# Patient Record
Sex: Female | Born: 1965 | Race: White | Hispanic: No | Marital: Married | Smoking: Former smoker
Health system: Southern US, Community
[De-identification: ages and names within clinical notes are randomized; demographics above are authoritative.]

## PROBLEM LIST (undated history)

## (undated) DIAGNOSIS — M7670 Peroneal tendinitis, unspecified leg: Secondary | ICD-10-CM

## (undated) DIAGNOSIS — R091 Pleurisy: Secondary | ICD-10-CM

## (undated) DIAGNOSIS — K589 Irritable bowel syndrome without diarrhea: Secondary | ICD-10-CM

## (undated) DIAGNOSIS — T4145XA Adverse effect of unspecified anesthetic, initial encounter: Secondary | ICD-10-CM

## (undated) DIAGNOSIS — T8859XA Other complications of anesthesia, initial encounter: Secondary | ICD-10-CM

## (undated) HISTORY — PX: COLONOSCOPY: SHX174

## (undated) HISTORY — PX: ABDOMINAL HYSTERECTOMY: SHX81

## (undated) HISTORY — PX: ELBOW SURGERY: SHX618

---

## 2004-12-26 ENCOUNTER — Emergency Department: Payer: Self-pay | Admitting: Emergency Medicine

## 2007-02-24 ENCOUNTER — Ambulatory Visit: Payer: Self-pay | Admitting: Family Medicine

## 2007-03-27 ENCOUNTER — Ambulatory Visit: Payer: Self-pay | Admitting: Family Medicine

## 2011-07-21 ENCOUNTER — Emergency Department: Payer: Self-pay | Admitting: *Deleted

## 2011-11-18 ENCOUNTER — Ambulatory Visit: Payer: Self-pay | Admitting: Gastroenterology

## 2011-11-18 HISTORY — PX: ESOPHAGOGASTRODUODENOSCOPY: SHX1529

## 2014-02-02 ENCOUNTER — Ambulatory Visit: Payer: Self-pay

## 2014-02-02 LAB — RAPID INFLUENZA A&B ANTIGENS (ARMC ONLY)

## 2014-07-02 ENCOUNTER — Ambulatory Visit: Payer: Self-pay

## 2014-07-02 LAB — URINALYSIS, COMPLETE
BILIRUBIN, UR: NEGATIVE
GLUCOSE, UR: NEGATIVE
Ketone: NEGATIVE
Nitrite: NEGATIVE
PH: 7 (ref 5.0–8.0)
Protein: 30
RBC,UR: >30 /HPF (ref 0–5)
Specific Gravity: 1.01 (ref 1.000–1.030)
WBC UR: >30 /HPF (ref 0–5)

## 2014-07-04 LAB — URINE CULTURE

## 2015-01-10 ENCOUNTER — Ambulatory Visit: Payer: Self-pay | Admitting: Gastroenterology

## 2015-04-29 ENCOUNTER — Other Ambulatory Visit: Payer: Self-pay | Admitting: Podiatry

## 2015-04-29 DIAGNOSIS — M7672 Peroneal tendinitis, left leg: Secondary | ICD-10-CM

## 2015-04-29 DIAGNOSIS — M25472 Effusion, left ankle: Secondary | ICD-10-CM

## 2015-04-29 DIAGNOSIS — M25572 Pain in left ankle and joints of left foot: Principal | ICD-10-CM

## 2015-05-03 ENCOUNTER — Ambulatory Visit
Admission: RE | Admit: 2015-05-03 | Discharge: 2015-05-03 | Disposition: A | Discharge status: Home / Self Care | Payer: BLUE CROSS/BLUE SHIELD | Source: Ambulatory Visit | Attending: Podiatry | Admitting: Podiatry

## 2015-05-03 DIAGNOSIS — M25472 Effusion, left ankle: Secondary | ICD-10-CM | POA: Diagnosis present

## 2015-05-03 DIAGNOSIS — M25872 Other specified joint disorders, left ankle and foot: Secondary | ICD-10-CM | POA: Insufficient documentation

## 2015-05-03 DIAGNOSIS — M25572 Pain in left ankle and joints of left foot: Secondary | ICD-10-CM

## 2015-05-03 DIAGNOSIS — M65872 Other synovitis and tenosynovitis, left ankle and foot: Secondary | ICD-10-CM | POA: Insufficient documentation

## 2015-05-03 DIAGNOSIS — S96812A Strain of other specified muscles and tendons at ankle and foot level, left foot, initial encounter: Secondary | ICD-10-CM | POA: Diagnosis not present

## 2015-05-03 DIAGNOSIS — M7672 Peroneal tendinitis, left leg: Secondary | ICD-10-CM

## 2015-05-07 ENCOUNTER — Ambulatory Visit: Payer: Self-pay

## 2015-06-02 NOTE — Discharge Instructions (Signed)
[facility] °[facility] ° °POST OPERATIVE INSTRUCTIONS FOR DR. TROXLER AND DR. FOWLER °[facility] ° ° °1. Take your medication as prescribed.  Pain medication should be taken only as needed. ° °2. Keep the dressing clean, dry and intact. ° °3. Keep your foot elevated above the heart level for the first 48 hours. ° °4. Walking to the bathroom and brief periods of walking are acceptable, unless we have instructed you to be non-weight bearing. ° °5. Always wear your post-op shoe when walking.  Always use your crutches if you are to be non-weight bearing. ° °6. Do not take a shower. Baths are permissible as long as the foot is kept out of the water.  ° °7. Every hour you are awake:  °- Bend your knee 15 times. °- Flex foot 15 times °- Massage calf 15 times ° °8. Call [facility] ([phone]) if any of the following problems occur: °- You develop a temperature or fever. °- The bandage becomes saturated with blood. °- Medication does not stop your pain. °- Injury of the foot occurs. °- Any symptoms of infection including redness, odor, or red streaks running from wound. °-  ° °General Anesthesia, Care After °Refer to this sheet in the next few weeks. These instructions provide you with information on caring for yourself after your procedure. Your health care provider may also give you more specific instructions. Your treatment has been planned according to current medical practices, but problems sometimes occur. Call your health care provider if you have any problems or questions after your procedure. °WHAT TO EXPECT AFTER THE PROCEDURE °After the procedure, it is typical to experience: °· Sleepiness. °· Nausea and vomiting. °HOME CARE INSTRUCTIONS °· For the first 24 hours after general anesthesia: °¨ Have a responsible person with you. °¨ Do not drive a car. If you are alone, do not take public transportation. °¨ Do not drink alcohol. °¨ Do not take medicine  that has not been prescribed by your health care provider. °¨ Do not sign important papers or make important decisions. °¨ You may resume a normal diet and activities as directed by your health care provider. °· Change bandages (dressings) as directed. °· If you have questions or problems that seem related to general anesthesia, call the hospital and ask for the anesthetist or anesthesiologist on call. °SEEK MEDICAL CARE IF: °· You have nausea and vomiting that continue the day after anesthesia. °· You develop a rash. °SEEK IMMEDIATE MEDICAL CARE IF:  °· You have difficulty breathing. °· You have chest pain. °· You have any allergic problems. °Document Released: 01/17/2001 Document Revised: 10/16/2013 Document Reviewed: 04/26/2013 °ExitCare® Patient Information ©2015 [employer]. This information is not intended to replace advice given to you by your health care provider. Make sure you discuss any questions you have with your health care provider. ° °

## 2015-06-04 ENCOUNTER — Encounter
Admission: RE | Disposition: A | Discharge status: Home / Self Care | Payer: Self-pay | Source: Ambulatory Visit | Attending: Podiatry

## 2015-06-04 ENCOUNTER — Ambulatory Visit: Payer: BLUE CROSS/BLUE SHIELD | Admitting: Anesthesiology

## 2015-06-04 ENCOUNTER — Ambulatory Visit
Admission: RE | Admit: 2015-06-04 | Discharge: 2015-06-04 | Disposition: A | Discharge status: Home / Self Care | Payer: BLUE CROSS/BLUE SHIELD | Source: Ambulatory Visit | Attending: Podiatry | Admitting: Podiatry

## 2015-06-04 DIAGNOSIS — S86312A Strain of muscle(s) and tendon(s) of peroneal muscle group at lower leg level, left leg, initial encounter: Secondary | ICD-10-CM | POA: Insufficient documentation

## 2015-06-04 DIAGNOSIS — K589 Irritable bowel syndrome without diarrhea: Secondary | ICD-10-CM | POA: Diagnosis not present

## 2015-06-04 DIAGNOSIS — Z801 Family history of malignant neoplasm of trachea, bronchus and lung: Secondary | ICD-10-CM | POA: Insufficient documentation

## 2015-06-04 DIAGNOSIS — Z87891 Personal history of nicotine dependence: Secondary | ICD-10-CM | POA: Diagnosis not present

## 2015-06-04 DIAGNOSIS — X58XXXA Exposure to other specified factors, initial encounter: Secondary | ICD-10-CM | POA: Insufficient documentation

## 2015-06-04 DIAGNOSIS — Z808 Family history of malignant neoplasm of other organs or systems: Secondary | ICD-10-CM | POA: Insufficient documentation

## 2015-06-04 DIAGNOSIS — Z86010 Personal history of colonic polyps: Secondary | ICD-10-CM | POA: Insufficient documentation

## 2015-06-04 DIAGNOSIS — M65872 Other synovitis and tenosynovitis, left ankle and foot: Secondary | ICD-10-CM | POA: Diagnosis not present

## 2015-06-04 DIAGNOSIS — Z90710 Acquired absence of both cervix and uterus: Secondary | ICD-10-CM | POA: Diagnosis not present

## 2015-06-04 DIAGNOSIS — S96812A Strain of other specified muscles and tendons at ankle and foot level, left foot, initial encounter: Secondary | ICD-10-CM | POA: Insufficient documentation

## 2015-06-04 DIAGNOSIS — Z79899 Other long term (current) drug therapy: Secondary | ICD-10-CM | POA: Diagnosis not present

## 2015-06-04 DIAGNOSIS — Z800 Family history of malignant neoplasm of digestive organs: Secondary | ICD-10-CM | POA: Diagnosis not present

## 2015-06-04 HISTORY — DX: Peroneal tendinitis, unspecified leg: M76.70

## 2015-06-04 HISTORY — PX: TENOTOMY / FLEXOR TENDON TRANSFER: SHX6629

## 2015-06-04 HISTORY — DX: Pleurisy: R09.1

## 2015-06-04 HISTORY — DX: Other complications of anesthesia, initial encounter: T88.59XA

## 2015-06-04 HISTORY — DX: Irritable bowel syndrome, unspecified: K58.9

## 2015-06-04 HISTORY — DX: Adverse effect of unspecified anesthetic, initial encounter: T41.45XA

## 2015-06-04 SURGERY — TENOTOMY / FLEXOR TENDON TRANSFER
Anesthesia: General | Laterality: Left | Wound class: Clean

## 2015-06-04 MED ORDER — DEXAMETHASONE SODIUM PHOSPHATE 4 MG/ML IJ SOLN
INTRAMUSCULAR | Status: DC | PRN
  Administered 2015-06-04: 4 mg via INTRAVENOUS

## 2015-06-04 MED ORDER — LIDOCAINE HCL (CARDIAC) 20 MG/ML IV SOLN
INTRAVENOUS | Status: DC | PRN
  Administered 2015-06-04: 50 mg via INTRATRACHEAL

## 2015-06-04 MED ORDER — BUPIVACAINE HCL (PF) 0.25 % IJ SOLN
INTRAMUSCULAR | Status: DC | PRN
  Administered 2015-06-04: 10 mL

## 2015-06-04 MED ORDER — MIDAZOLAM HCL 5 MG/5ML IJ SOLN
INTRAMUSCULAR | Status: DC | PRN
  Administered 2015-06-04 (×2): 2 mg via INTRAVENOUS

## 2015-06-04 MED ORDER — OXYCODONE-ACETAMINOPHEN 5-325 MG PO TABS: 1.0000 | ORAL_TABLET | ORAL | Status: DC | PRN

## 2015-06-04 MED ORDER — DEXTROSE 5 % IV SOLN
2000.0000 mg | Freq: Once | INTRAVENOUS | Status: AC
  Administered 2015-06-04: 2000 mg via INTRAVENOUS

## 2015-06-04 MED ORDER — PROPOFOL 10 MG/ML IV BOLUS
INTRAVENOUS | Status: DC | PRN
  Administered 2015-06-04: 100 mg via INTRAVENOUS

## 2015-06-04 MED ORDER — HYDROMORPHONE HCL 1 MG/ML IJ SOLN: 0.2500 mg | INTRAMUSCULAR | Status: DC | PRN

## 2015-06-04 MED ORDER — ONDANSETRON HCL 4 MG PO TABS: 4.0000 mg | ORAL_TABLET | Freq: Four times a day (QID) | ORAL | Status: DC | PRN

## 2015-06-04 MED ORDER — OXYCODONE HCL 5 MG PO TABS: 5.0000 mg | ORAL_TABLET | Freq: Once | ORAL | Status: DC | PRN

## 2015-06-04 MED ORDER — ONDANSETRON HCL 4 MG/2ML IJ SOLN: 4.0000 mg | Freq: Four times a day (QID) | INTRAMUSCULAR | Status: DC | PRN

## 2015-06-04 MED ORDER — ROPIVACAINE HCL 5 MG/ML IJ SOLN
INTRAMUSCULAR | Status: DC | PRN
  Administered 2015-06-04: 35 mL via PERINEURAL

## 2015-06-04 MED ORDER — ONDANSETRON HCL 4 MG/2ML IJ SOLN
INTRAMUSCULAR | Status: DC | PRN
  Administered 2015-06-04: 4 mg via INTRAVENOUS

## 2015-06-04 MED ORDER — GLYCOPYRROLATE 0.2 MG/ML IJ SOLN
INTRAMUSCULAR | Status: DC | PRN
  Administered 2015-06-04: 0.1 mg via INTRAVENOUS

## 2015-06-04 MED ORDER — OXYCODONE HCL 5 MG/5ML PO SOLN: 5.0000 mg | Freq: Once | ORAL | Status: DC | PRN

## 2015-06-04 MED ORDER — FENTANYL CITRATE (PF) 100 MCG/2ML IJ SOLN
INTRAMUSCULAR | Status: DC | PRN
  Administered 2015-06-04: 100 ug via INTRAVENOUS

## 2015-06-04 MED ORDER — LACTATED RINGERS IV SOLN
INTRAVENOUS | Status: DC
  Administered 2015-06-04: 12:00:00 via INTRAVENOUS

## 2015-06-04 SURGICAL SUPPLY — 54 items
APL SKNCLS STERI-STRIP NONHPOA (GAUZE/BANDAGES/DRESSINGS) ×1
BENZOIN TINCTURE PRP APPL 2/3 (GAUZE/BANDAGES/DRESSINGS) ×2 IMPLANT
BLADE MED AGGRESSIVE (BLADE) IMPLANT
BLADE OSC/SAGITTAL 5.5X25 (BLADE) IMPLANT
BLADE OSC/SAGITTAL MD 5.5X18 (BLADE) IMPLANT
BLADE OSC/SAGITTAL MD 9X18.5 (BLADE) IMPLANT
BLADE SURG 15 STRL LF DISP TIS (BLADE) IMPLANT
BLADE SURG 15 STRL SS (BLADE)
BNDG CMPR 75X41 PLY HI ABS (GAUZE/BANDAGES/DRESSINGS) ×1
BNDG COHESIVE 4X5 TAN STRL (GAUZE/BANDAGES/DRESSINGS) ×1 IMPLANT
BNDG ESMARK 4X12 TAN STRL LF (GAUZE/BANDAGES/DRESSINGS) ×2 IMPLANT
BNDG GAUZE 4.5X4.1 6PLY STRL (MISCELLANEOUS) ×2 IMPLANT
BNDG STRETCH 4X75 STRL LF (GAUZE/BANDAGES/DRESSINGS) ×2 IMPLANT
CANISTER SUCT 1200ML W/VALVE (MISCELLANEOUS) ×2 IMPLANT
COVER PIN YLW 0.028-062 (MISCELLANEOUS) IMPLANT
CUFF TOURNIQUET DUAL PORT 18X3 (MISCELLANEOUS) ×1 IMPLANT
DRAPE FLUOR MINI C-ARM 54X84 (DRAPES) ×1 IMPLANT
DURAPREP 26ML APPLICATOR (WOUND CARE) ×2 IMPLANT
GAUZE PETRO XEROFOAM 1X8 (MISCELLANEOUS) ×2 IMPLANT
GAUZE PETRO XEROFOAM 5X9 (MISCELLANEOUS) IMPLANT
GAUZE SPONGE 4X4 12PLY STRL (GAUZE/BANDAGES/DRESSINGS) ×2 IMPLANT
GLOVE BIO SURGEON STRL SZ7.5 (GLOVE) ×2 IMPLANT
GLOVE INDICATOR 8.0 STRL GRN (GLOVE) ×2 IMPLANT
GOWN STRL REUS W/ TWL LRG LVL3 (GOWN DISPOSABLE) ×2 IMPLANT
GOWN STRL REUS W/TWL LRG LVL3 (GOWN DISPOSABLE) ×4
K-WIRE DBL END TROCAR 6X.045 (WIRE)
K-WIRE DBL END TROCAR 6X.062 (WIRE)
KWIRE DBL END TROCAR 6X.045 (WIRE) IMPLANT
KWIRE DBL END TROCAR 6X.062 (WIRE) IMPLANT
NS IRRIG 500ML POUR BTL (IV SOLUTION) ×2 IMPLANT
PACK EXTREMITY ARMC (MISCELLANEOUS) ×2 IMPLANT
PAD GROUND ADULT SPLIT (MISCELLANEOUS) ×2 IMPLANT
RASP SM TEAR CROSS CUT (RASP) IMPLANT
SPLINT CAST 1 STEP 4X30 (MISCELLANEOUS) IMPLANT
STOCKINETTE STRL 6IN 960660 (GAUZE/BANDAGES/DRESSINGS) ×2 IMPLANT
STRIP CLOSURE SKIN 1/4X4 (GAUZE/BANDAGES/DRESSINGS) ×2 IMPLANT
SUT ETHILON 4-0 (SUTURE) ×2
SUT ETHILON 4-0 FS2 18XMFL BLK (SUTURE) ×1
SUT ETHILON 5-0 FS-2 18 BLK (SUTURE) IMPLANT
SUT MNCRL 4-0 (SUTURE)
SUT MNCRL 4-0 27XMFL (SUTURE)
SUT MNCRL 5-0+ PC-1 (SUTURE) IMPLANT
SUT MONOCRYL 5-0 (SUTURE) ×1
SUT PROLENE 4 0 PS 2 18 (SUTURE) ×2 IMPLANT
SUT VIC AB 0 CT1 36 (SUTURE) IMPLANT
SUT VIC AB 2-0 SH 27 (SUTURE)
SUT VIC AB 2-0 SH 27XBRD (SUTURE) IMPLANT
SUT VIC AB 3-0 SH 27 (SUTURE)
SUT VIC AB 3-0 SH 27X BRD (SUTURE) IMPLANT
SUT VIC AB 4-0 FS2 27 (SUTURE) ×2 IMPLANT
SUT VICRYL AB 3-0 FS1 BRD 27IN (SUTURE) IMPLANT
SUTURE ETHLN 4-0 FS2 18XMF BLK (SUTURE) IMPLANT
SUTURE MNCRL 4-0 27XMF (SUTURE) IMPLANT
WAND TOPAZ MICRO DEBRIDER (MISCELLANEOUS) ×1 IMPLANT

## 2015-06-04 NOTE — Progress Notes (Signed)
Assisted Sherren Kerns ANMD with left popliteal block. Side rails up, monitors on throughout procedure. See vital signs in flow sheet. Tolerated Procedure well.

## 2015-06-04 NOTE — Transfer of Care (Signed)
Immediate Anesthesia Transfer of Care Note  Patient: Nicole Fletcher  Procedure(s) Performed: Procedure(s) with comments: TENOTOMY / FLEXOR TENDON REPAIR (Left) - POPLITEAL  Patient Location: PACU  Anesthesia Type: General LMA  Level of Consciousness: awake, alert  and patient cooperative  Airway and Oxygen Therapy: Patient Spontanous Breathing and Patient connected to supplemental oxygen  Post-op Assessment: Post-op Vital signs reviewed, Patient's Cardiovascular Status Stable, Respiratory Function Stable, Patent Airway and No signs of Nausea or vomiting  Post-op Vital Signs: Reviewed and stable  Complications: No apparent anesthesia complications

## 2015-06-04 NOTE — H&P (Signed)
  HISTORY AND PHYSICAL INTERVAL NOTE:  06/04/2015  11:54 AM  Nicole Fletcher  has presented today for surgery, with the diagnosis of S86.3125 PERONEAL TENDOR TEAR LEFT  M76.72 PERONEAL TENDINITIS LEFT.  The various methods of treatment have been discussed with the patient.  No guarantees were given.  After consideration of risks, benefits and other options for treatment, the patient has consented to surgery.  I have reviewed the patients' chart and labs.    Patient Vitals for the past 24 hrs:  BP Temp Temp src Pulse Resp SpO2 Height Weight  06/04/15 1131 (!) 152/84 mmHg 98.1 F (36.7 C) Temporal 77 14 99 %  (1.6 m) 58.968 kg (130 lb)    A history and physical examination was performed in my office.  The patient was reexamined.  There have been no changes to this history and physical examination.  Gwyneth Revels A

## 2015-06-04 NOTE — Anesthesia Preprocedure Evaluation (Signed)
Anesthesia Evaluation  Patient identified by MRN, date of birth, ID band Patient awake    Reviewed: Allergy & Precautions, H&P , Patient's Chart, lab work & pertinent test results  Airway Mallampati: I  TM Distance: >3 FB Neck ROM: full    Dental no notable dental hx.    Pulmonary former smoker,    Pulmonary exam normal       Cardiovascular negative cardio ROS Normal cardiovascular exam    Neuro/Psych    GI/Hepatic negative GI ROS, Neg liver ROS,   Endo/Other  negative endocrine ROS  Renal/GU negative Renal ROS     Musculoskeletal   Abdominal   Peds  Hematology negative hematology ROS (+)   Anesthesia Other Findings   Reproductive/Obstetrics                             Anesthesia Physical Anesthesia Plan  ASA: I  Anesthesia Plan: General LMA   Post-op Pain Management: GA combined w/ Regional for post-op pain   Induction:   Airway Management Planned:   Additional Equipment:   Intra-op Plan:   Post-operative Plan:   Informed Consent: I have reviewed the patients History and Physical, chart, labs and discussed the procedure including the risks, benefits and alternatives for the proposed anesthesia with the patient or authorized representative who has indicated his/her understanding and acceptance.     Plan Discussed with: CRNA  Anesthesia Plan Comments:         Anesthesia Quick Evaluation

## 2015-06-04 NOTE — Anesthesia Postprocedure Evaluation (Signed)
  Anesthesia Post-op Note  Patient: Nicole Fletcher  Procedure(s) Performed: Procedure(s) with comments: TENOTOMY / FLEXOR TENDON REPAIR (Left) - POPLITEAL  Anesthesia type:General LMA  Patient location: PACU  Post pain: Pain level controlled  Post assessment: Post-op Vital signs reviewed, Patient's Cardiovascular Status Stable, Respiratory Function Stable, Patent Airway and No signs of Nausea or vomiting  Post vital signs: Reviewed and stable  Last Vitals:  Filed Vitals:   06/04/15 1430  BP: 115/81  Pulse: 66  Temp:   Resp: 12    Level of consciousness: awake, alert  and patient cooperative  Complications: No apparent anesthesia complications

## 2015-06-04 NOTE — Anesthesia Procedure Notes (Addendum)
Procedure Name: LMA Insertion Date/Time: 06/04/2015 12:17 PM Performed by: Jimmy Picket Pre-anesthesia Checklist: Patient identified, Emergency Drugs available, Suction available, Timeout performed and Patient being monitored Patient Re-evaluated:Patient Re-evaluated prior to inductionOxygen Delivery Method: Circle system utilized Preoxygenation: Pre-oxygenation with 100% oxygen Intubation Type: IV induction LMA: LMA inserted LMA Size: 4.0 Number of attempts: 1 Placement Confirmation: positive ETCO2 and breath sounds checked- equal and bilateral Tube secured with: Tape   Anesthesia Regional Block:  Popliteal block  Pre-Anesthetic Checklist: ,, timeout performed, Correct Patient, Correct Site, Correct Laterality, Correct Procedure, Correct Position, site marked, Risks and benefits discussed,  Surgical consent,  Pre-op evaluation,  At surgeon's request and post-op pain management  Laterality: Left  Prep: chloraprep       Needles:  Injection technique: Single-shot  Needle Type: Echogenic Needle     Needle Length: 9cm 9 cm Needle Gauge: 21 and 21 G    Additional Needles:  Procedures: ultrasound guided (picture in chart) Popliteal block Narrative:  Start time: 06/04/2015 12:00 PM End time: 06/04/2015 12:07 PM Injection made incrementally with aspirations every 5 mL.  Performed by: Personally  Anesthesiologist: Jolayne Panther  Additional Notes: Functioning IV was confirmed and monitors applied. Ultrasound guidance: relevant anatomy identified, needle position confirmed, local anesthetic spread visualized around nerve(s)., vascular puncture avoided.  Image printed for medical record.  Negative aspiration and no paresthesias; incremental administration of local anesthetic. The patient tolerated the procedure well. Vitals signes recorded in RN notes.

## 2015-06-04 NOTE — Op Note (Signed)
Operative note   Surgeon:Suzzane Quilter [employer] forces logistics/support/administrative officer: None    Preop diagnosis: 1. Peroneal brevis tendon tear 2. Peroneal longus tenosynovitis    Postop diagnosis: Same    Procedure: 1. Peroneal brevis tendon repair 2. Peroneal longus tenolysis    EBL: Minimal    Anesthesia:regional and general    Hemostasis: Thigh tourniquet inflated to 325 mmHg for approximately 60 minutes    Specimen: Peroneal tendon tear with low-lying peroneal brevis muscle belly    Complications: None    Operative indications: A 49 year old female with complaint of pain to the lateral aspect of her left ankle. MRI showed a peroneal brevis tendon tear with tenosynovitis. She presents today for surgical intervention.    Procedure:  Patient was brought into the OR and placed on the operating table in thesupine position. After anesthesia was obtained theleft lower extremity was prepped and draped in usual sterile fashion.  Attention was directed to the lateral aspect of the ankle where a longitudinal incision was made along the peroneal tendons from occiput to the ankle to the fifth metatarsal base region. Sharp and blunt dissection was carried down to the tendon sheath. Peroneal tendon sheath was then open. There was noted to be synovitis of the peroneus longus tendon. At the level of the fibular groove there was a small peroneal brevis tendon longitudinal tear. The longitudinal tear was debrided and a small amount of the tear was then removed and sent for pathological examination. There was noted to be a low-lying peroneal brevis muscle belly and portion of this was sharply excised to proximal to the fibular groove. The tendon repair was performed with a 40 proline suture. A intratendinous suture was initially placed followed by a running suture to tubularize the tendon. Attention was directed to the peroneal longus tendon where a small amount of the synovitis was taken from the tendon sheath region. Both the peroneal  longus and brevis tendons were then infiltrated with the Topaz wand. The wound was then flushed with copious amounts or irrigation. The peroneal tendon sheath was reapproximated with a 4-0 Vicryl. The subcutaneous tissue was drilled proximally with a 4-0 Vicryl. The skin was reapproximated with a 4-0 nylon.    Patient tolerated the procedure and anesthesia well.  Was transported from the OR to the PACU with all vital signs stable and vascular status intact. To be discharged per routine protocol.  Will follow up in approximately 1 week in the outpatient clinic. She will remain nonweightbearing until further notice.

## 2015-06-05 ENCOUNTER — Encounter: Payer: Self-pay | Admitting: Podiatry

## 2015-06-09 LAB — SURGICAL PATHOLOGY

## 2015-08-13 ENCOUNTER — Ambulatory Visit
Admission: RE | Admit: 2015-08-13 | Discharge: 2015-08-13 | Disposition: A | Discharge status: Home / Self Care | Payer: BLUE CROSS/BLUE SHIELD | Source: Ambulatory Visit | Attending: Unknown Physician Specialty | Admitting: Unknown Physician Specialty

## 2015-08-13 ENCOUNTER — Other Ambulatory Visit: Payer: Self-pay | Admitting: Unknown Physician Specialty

## 2015-08-13 DIAGNOSIS — R109 Unspecified abdominal pain: Secondary | ICD-10-CM

## 2015-08-13 DIAGNOSIS — R399 Unspecified symptoms and signs involving the genitourinary system: Secondary | ICD-10-CM

## 2015-08-14 ENCOUNTER — Ambulatory Visit: Payer: BLUE CROSS/BLUE SHIELD

## 2016-03-05 ENCOUNTER — Other Ambulatory Visit: Payer: Self-pay | Admitting: Internal Medicine

## 2016-03-05 DIAGNOSIS — R1013 Epigastric pain: Secondary | ICD-10-CM

## 2016-06-04 ENCOUNTER — Ambulatory Visit: Payer: BLUE CROSS/BLUE SHIELD

## 2016-06-07 ENCOUNTER — Ambulatory Visit
Admission: RE | Admit: 2016-06-07 | Discharge: 2016-06-07 | Disposition: A | Discharge status: Home / Self Care | Payer: BLUE CROSS/BLUE SHIELD | Source: Ambulatory Visit | Attending: Internal Medicine | Admitting: Internal Medicine

## 2016-06-07 DIAGNOSIS — R1013 Epigastric pain: Secondary | ICD-10-CM | POA: Diagnosis not present

## 2017-03-07 ENCOUNTER — Other Ambulatory Visit: Payer: Self-pay | Admitting: Internal Medicine

## 2017-03-07 DIAGNOSIS — Z1231 Encounter for screening mammogram for malignant neoplasm of breast: Secondary | ICD-10-CM

## 2017-04-07 ENCOUNTER — Ambulatory Visit: Payer: BLUE CROSS/BLUE SHIELD

## 2017-06-09 ENCOUNTER — Ambulatory Visit
Admission: RE | Admit: 2017-06-09 | Discharge: 2017-06-09 | Disposition: A | Discharge status: Home / Self Care | Payer: BLUE CROSS/BLUE SHIELD | Source: Ambulatory Visit | Attending: Internal Medicine | Admitting: Internal Medicine

## 2017-06-09 DIAGNOSIS — Z1231 Encounter for screening mammogram for malignant neoplasm of breast: Secondary | ICD-10-CM | POA: Insufficient documentation

## 2017-06-15 ENCOUNTER — Other Ambulatory Visit: Payer: Self-pay | Admitting: *Deleted

## 2017-06-15 ENCOUNTER — Inpatient Hospital Stay
Admission: RE | Admit: 2017-06-15 | Discharge: 2017-06-15 | Disposition: A | Discharge status: Home / Self Care | Payer: Self-pay | Source: Ambulatory Visit | Attending: *Deleted | Admitting: *Deleted

## 2017-06-15 DIAGNOSIS — Z9289 Personal history of other medical treatment: Secondary | ICD-10-CM

## 2018-07-03 ENCOUNTER — Other Ambulatory Visit: Payer: Self-pay | Admitting: Obstetrics and Gynecology

## 2018-07-03 DIAGNOSIS — Z1231 Encounter for screening mammogram for malignant neoplasm of breast: Secondary | ICD-10-CM

## 2018-07-20 ENCOUNTER — Ambulatory Visit
Admission: RE | Admit: 2018-07-20 | Discharge: 2018-07-20 | Disposition: A | Discharge status: Home / Self Care | Payer: BLUE CROSS/BLUE SHIELD | Source: Ambulatory Visit | Attending: Obstetrics and Gynecology | Admitting: Obstetrics and Gynecology

## 2018-07-20 DIAGNOSIS — Z1231 Encounter for screening mammogram for malignant neoplasm of breast: Secondary | ICD-10-CM | POA: Diagnosis present

## 2019-01-12 ENCOUNTER — Other Ambulatory Visit (HOSPITAL_COMMUNITY): Payer: Self-pay | Admitting: Podiatry

## 2019-01-12 ENCOUNTER — Other Ambulatory Visit: Payer: Self-pay | Admitting: Podiatry

## 2019-01-12 DIAGNOSIS — G5762 Lesion of plantar nerve, left lower limb: Secondary | ICD-10-CM

## 2019-01-16 ENCOUNTER — Telehealth (INDEPENDENT_AMBULATORY_CARE_PROVIDER_SITE_OTHER): Payer: Self-pay | Admitting: Specialist

## 2019-01-16 NOTE — Telephone Encounter (Signed)
Noted. Thanks.

## 2019-01-16 NOTE — Telephone Encounter (Signed)
Patient returned your call. Asked ALL screening questions and ALL answers no. Pt will be at appt 01/17/19

## 2019-01-17 ENCOUNTER — Encounter (INDEPENDENT_AMBULATORY_CARE_PROVIDER_SITE_OTHER): Payer: Self-pay | Admitting: Specialist

## 2019-01-17 ENCOUNTER — Ambulatory Visit (INDEPENDENT_AMBULATORY_CARE_PROVIDER_SITE_OTHER): Payer: BLUE CROSS/BLUE SHIELD

## 2019-01-17 ENCOUNTER — Other Ambulatory Visit: Payer: Self-pay

## 2019-01-17 ENCOUNTER — Ambulatory Visit (INDEPENDENT_AMBULATORY_CARE_PROVIDER_SITE_OTHER): Payer: BLUE CROSS/BLUE SHIELD | Admitting: Specialist

## 2019-01-17 VITALS — BP 133/92 | HR 70 | Ht 62.0 in | Wt 135.0 lb

## 2019-01-17 DIAGNOSIS — R200 Anesthesia of skin: Secondary | ICD-10-CM | POA: Diagnosis not present

## 2019-01-17 DIAGNOSIS — M4712 Other spondylosis with myelopathy, cervical region: Secondary | ICD-10-CM | POA: Diagnosis not present

## 2019-01-17 DIAGNOSIS — M4802 Spinal stenosis, cervical region: Secondary | ICD-10-CM | POA: Diagnosis not present

## 2019-01-17 DIAGNOSIS — M4722 Other spondylosis with radiculopathy, cervical region: Secondary | ICD-10-CM

## 2019-01-17 MED ORDER — AMITRIPTYLINE HCL 10 MG PO TABS: 10.0000 mg | ORAL_TABLET | Freq: Every day | ORAL | 3 refills | Status: DC

## 2019-01-17 MED ORDER — MELOXICAM 15 MG PO TABS: 15.0000 mg | ORAL_TABLET | Freq: Every day | ORAL | 2 refills | Status: AC

## 2019-01-17 NOTE — Patient Instructions (Addendum)
Avoid overhead lifting and overhead use of the arms. Do not lift greater than 5 lbs. Adjust head rest in vehicle to prevent hyperextension if rear ended. Take extra precautions to avoid falling. Scapula retraction exercises, Chin tuck and ears over shoulders helps. Consider an ergonomic table for positioning of the computer. Monitor at eye level or slightly down. Meloxicam 15 mg tablet for 2 weeks then discontinue. Elavil 10 mg po qhs  For nerve irritation. Physical therapy for cervical spine exercises and thoracic outlet exercises.  B complex tablet daily and use of creel oil for nerve regeneration and healing.

## 2019-01-17 NOTE — Progress Notes (Addendum)
Office Visit Note   Patient: Nicole Fletcher           Date of Birth: September 29, 1966           MRN: [id number] Visit Date: 01/17/2019              Requested by: Marguarite Arbour, MD [id number] [facility] [place], [place] [address] PCP: Marguarite Arbour, MD   Assessment & Plan: Visit Diagnoses:  1. Right arm numbness   2. Other spondylosis with radiculopathy, cervical region     Plan: Avoid overhead lifting and overhead use of the arms. Do not lift greater than 5 lbs. Adjust head rest in vehicle to prevent hyperextension if rear ended. Take extra precautions to avoid falling. Scapula retraction exercises, Chin tuck and ears pver shoulders helps. Consider an ergonomic table for positioning of the computer. Monitor at eye level or slightly down. Meloxicam 15 mg tablet for 2 weeks then discontinue. Elavil 10 mg po qhs  For nerve irritation. Physical therapy for cervical spine exercises and thoracic outlet exercises.  B complex tablet daily and use of creel oil for nerve regeneration and healing.  Follow-Up Instructions: Return in about 4 weeks (around 02/14/2019).   Orders:  Orders Placed This Encounter  Procedures  . XR Cervical Spine 2 or 3 views  . Ambulatory referral to Physical Therapy   No orders of the defined types were placed in this encounter.     Procedures: No procedures performed   Clinical Data: No additional findings.   Subjective: Chief Complaint  Patient presents with  . Left Arm - Numbness    53 year old right handed female with history of bilateral ulnar nerve surgeries by Dr. Onalee Hua 10 years ago. She had bilateral ulnar neurolysis. The symptoms present with diffent position, bending over, just sitting increases the discomfort. Raising the arm upwards helps. No gait disturbance, no balance or coordination difficulties. Noticed decreased grip in the right hand with holding gallon of milk and with laundry carrying.    Review of  Systems  Constitutional: Negative.   HENT: Negative.   Eyes: Negative.   Respiratory: Negative.   Cardiovascular: Negative.   Gastrointestinal: Negative.   Endocrine: Negative.   Genitourinary: Negative.   Musculoskeletal: Negative.   Skin: Negative.   Allergic/Immunologic: Negative.   Neurological: Negative.   Hematological: Negative.   Psychiatric/Behavioral: Negative.      Objective: Vital Signs: BP (!) 133/92 (BP Location: Left Arm, Patient Position: Sitting)   Pulse 70   Ht 5\' 2"  (1.575 m)   Wt 135 lb (61.2 kg)   BMI 24.69 kg/m   Physical Exam Constitutional:      Appearance: She is well-developed.  HENT:     Head: Normocephalic and atraumatic.  Eyes:     Pupils: Pupils are equal, round, and reactive to light.  Neck:     Musculoskeletal: Normal range of motion and neck supple.  Pulmonary:     Effort: Pulmonary effort is normal.     Breath sounds: Normal breath sounds.  Abdominal:     General: Bowel sounds are normal.     Palpations: Abdomen is soft.  Musculoskeletal: Normal range of motion.  Skin:    General: Skin is warm and dry.  Neurological:     Mental Status: She is alert and oriented to person, place, and time.  Psychiatric:        Behavior: Behavior normal.        Thought  Content: Thought content normal.        Judgment: Judgment normal.     Ortho Exam  Specialty Comments:  No specialty comments available.  Imaging: No results found.   PMFS History: There are no active problems to display for this patient.  Past Medical History:  Diagnosis Date  . Complication of anesthesia    TAKES AWHILE TO WAKE UP  . Irritable bowel syndrome   . Peroneal tendinitis    LEFT FOOT  . Pleurisy    HX OF    Family History  Problem Relation Age of Onset  . Breast cancer Neg Hx     Past Surgical History:  Procedure Laterality Date  . ABDOMINAL HYSTERECTOMY    . COLONOSCOPY  11/18/2011 AND 01/10/2015  . ELBOW SURGERY Bilateral   .  ESOPHAGOGASTRODUODENOSCOPY  11/18/2011  . TENOTOMY / FLEXOR TENDON TRANSFER Left 06/04/2015   Procedure: TENOTOMY / FLEXOR TENDON REPAIR;  Surgeon: Gwyneth Revels, DPM;  [facility];  Service: Podiatry;  Laterality: Left;  POPLITEAL   Social History   Occupational History  . Not on file  Tobacco Use  . Smoking status: Former Smoker    Packs/day: 0.00    Years: 6.00    Pack years: 0.00    Types: Cigarettes  . Smokeless tobacco: Never Used  Substance and Sexual Activity  . Alcohol use: Yes    Alcohol/week: 2.0 standard drinks    Types: 2 Glasses of wine per week    Comment: OCCASIOAL  . Drug use: Not on file  . Sexual activity: Not on file

## 2019-01-17 NOTE — Addendum Note (Signed)
Addended by: Vira Browns on: 01/17/2019 10:40 AM   Modules accepted: Orders

## 2019-02-08 ENCOUNTER — Other Ambulatory Visit (INDEPENDENT_AMBULATORY_CARE_PROVIDER_SITE_OTHER): Payer: Self-pay | Admitting: Specialist

## 2019-02-14 ENCOUNTER — Ambulatory Visit: Payer: BLUE CROSS/BLUE SHIELD

## 2019-03-01 ENCOUNTER — Ambulatory Visit
Admission: RE | Admit: 2019-03-01 | Discharge: 2019-03-01 | Disposition: A | Discharge status: Home / Self Care | Payer: BLUE CROSS/BLUE SHIELD | Source: Ambulatory Visit | Attending: Podiatry | Admitting: Podiatry

## 2019-03-01 ENCOUNTER — Other Ambulatory Visit: Payer: Self-pay

## 2019-03-01 DIAGNOSIS — G5762 Lesion of plantar nerve, left lower limb: Secondary | ICD-10-CM | POA: Diagnosis present

## 2019-03-01 MED ORDER — GADOBUTROL 1 MMOL/ML IV SOLN
6.0000 mL | Freq: Once | INTRAVENOUS | Status: AC | PRN
  Administered 2019-03-01: 6 mL via INTRAVENOUS

## 2020-01-29 ENCOUNTER — Other Ambulatory Visit: Payer: Self-pay | Admitting: Obstetrics and Gynecology

## 2020-01-29 DIAGNOSIS — Z1231 Encounter for screening mammogram for malignant neoplasm of breast: Secondary | ICD-10-CM

## 2020-02-11 ENCOUNTER — Encounter (INDEPENDENT_AMBULATORY_CARE_PROVIDER_SITE_OTHER): Payer: Self-pay

## 2020-02-11 ENCOUNTER — Other Ambulatory Visit: Payer: Self-pay

## 2020-02-11 ENCOUNTER — Ambulatory Visit
Admission: RE | Admit: 2020-02-11 | Discharge: 2020-02-11 | Disposition: A | Discharge status: Home / Self Care | Payer: BC Managed Care – PPO | Source: Ambulatory Visit | Attending: Obstetrics and Gynecology | Admitting: Obstetrics and Gynecology

## 2020-02-11 DIAGNOSIS — Z1231 Encounter for screening mammogram for malignant neoplasm of breast: Secondary | ICD-10-CM | POA: Diagnosis not present

## 2021-02-02 ENCOUNTER — Other Ambulatory Visit: Payer: Self-pay | Admitting: Obstetrics and Gynecology

## 2021-02-02 DIAGNOSIS — Z1231 Encounter for screening mammogram for malignant neoplasm of breast: Secondary | ICD-10-CM

## 2021-02-12 ENCOUNTER — Ambulatory Visit: Payer: BC Managed Care – PPO

## 2021-02-19 ENCOUNTER — Ambulatory Visit
Admission: RE | Admit: 2021-02-19 | Discharge: 2021-02-19 | Disposition: A | Discharge status: Home / Self Care | Payer: BC Managed Care – PPO | Source: Ambulatory Visit | Attending: Obstetrics and Gynecology | Admitting: Obstetrics and Gynecology

## 2021-02-19 ENCOUNTER — Ambulatory Visit: Payer: BC Managed Care – PPO

## 2021-02-19 ENCOUNTER — Other Ambulatory Visit: Payer: Self-pay

## 2021-02-19 DIAGNOSIS — Z1231 Encounter for screening mammogram for malignant neoplasm of breast: Secondary | ICD-10-CM | POA: Insufficient documentation

## 2022-02-01 ENCOUNTER — Other Ambulatory Visit: Payer: Self-pay | Admitting: Obstetrics and Gynecology

## 2022-02-01 DIAGNOSIS — Z1231 Encounter for screening mammogram for malignant neoplasm of breast: Secondary | ICD-10-CM

## 2022-03-08 ENCOUNTER — Ambulatory Visit
Admission: RE | Admit: 2022-03-08 | Discharge: 2022-03-08 | Disposition: A | Discharge status: Home / Self Care | Payer: BC Managed Care – PPO | Source: Ambulatory Visit | Attending: Obstetrics and Gynecology | Admitting: Obstetrics and Gynecology

## 2022-03-08 DIAGNOSIS — Z1231 Encounter for screening mammogram for malignant neoplasm of breast: Secondary | ICD-10-CM | POA: Insufficient documentation

## 2023-02-18 ENCOUNTER — Other Ambulatory Visit: Payer: Self-pay

## 2023-02-18 DIAGNOSIS — Z1231 Encounter for screening mammogram for malignant neoplasm of breast: Secondary | ICD-10-CM

## 2023-03-17 ENCOUNTER — Ambulatory Visit
Admission: RE | Admit: 2023-03-17 | Discharge: 2023-03-17 | Disposition: A | Discharge status: Home / Self Care | Payer: BC Managed Care – PPO | Source: Ambulatory Visit | Attending: Obstetrics and Gynecology | Admitting: Obstetrics and Gynecology

## 2023-03-17 DIAGNOSIS — Z1231 Encounter for screening mammogram for malignant neoplasm of breast: Secondary | ICD-10-CM | POA: Insufficient documentation

## 2023-05-19 ENCOUNTER — Other Ambulatory Visit: Payer: Self-pay | Admitting: Internal Medicine

## 2023-05-19 DIAGNOSIS — E782 Mixed hyperlipidemia: Secondary | ICD-10-CM

## 2023-05-19 DIAGNOSIS — R9439 Abnormal result of other cardiovascular function study: Secondary | ICD-10-CM

## 2023-05-25 ENCOUNTER — Other Ambulatory Visit (HOSPITAL_COMMUNITY): Payer: Self-pay | Admitting: Emergency Medicine

## 2023-05-25 ENCOUNTER — Telehealth (HOSPITAL_COMMUNITY): Payer: Self-pay | Admitting: Emergency Medicine

## 2023-05-25 ENCOUNTER — Encounter (HOSPITAL_COMMUNITY): Payer: Self-pay

## 2023-05-25 DIAGNOSIS — R079 Chest pain, unspecified: Secondary | ICD-10-CM

## 2023-05-25 MED ORDER — METOPROLOL TARTRATE 100 MG PO TABS: 100.0000 mg | ORAL_TABLET | Freq: Once | ORAL | 0 refills | Status: AC

## 2023-05-25 NOTE — Telephone Encounter (Signed)
Attempted to call patient regarding upcoming cardiac CT appointment. °Left message on voicemail with name and callback number °Sara Wallace RN Navigator Cardiac Imaging °[facility] °[phone] Office °[phone] Cell ° °

## 2023-05-26 ENCOUNTER — Ambulatory Visit
Admission: RE | Admit: 2023-05-26 | Discharge: 2023-05-26 | Disposition: A | Discharge status: Home / Self Care | Payer: BC Managed Care – PPO | Source: Ambulatory Visit | Attending: Internal Medicine | Admitting: Internal Medicine

## 2023-05-26 DIAGNOSIS — E782 Mixed hyperlipidemia: Secondary | ICD-10-CM | POA: Insufficient documentation

## 2023-05-26 DIAGNOSIS — R9439 Abnormal result of other cardiovascular function study: Secondary | ICD-10-CM | POA: Diagnosis present

## 2023-05-26 MED ORDER — IOHEXOL 350 MG/ML SOLN
75.0000 mL | Freq: Once | INTRAVENOUS | Status: AC | PRN
  Administered 2023-05-26: 75 mL via INTRAVENOUS

## 2023-05-26 MED ORDER — NITROGLYCERIN 0.4 MG SL SUBL
0.8000 mg | SUBLINGUAL_TABLET | Freq: Once | SUBLINGUAL | Status: AC
  Administered 2023-05-26: 0.8 mg via SUBLINGUAL
  Filled 2023-05-26: qty 2

## 2023-05-26 MED ORDER — SODIUM CHLORIDE 0.9 % IV SOLN: INTRAVENOUS | Status: DC

## 2023-05-26 NOTE — Progress Notes (Signed)
Patient tolerated CT well. Vital signs stable encourage to drink water throughout day.Reasons explained and verbalized understanding. Ambulated steady gait.   

## 2024-02-29 ENCOUNTER — Other Ambulatory Visit: Payer: Self-pay | Admitting: Obstetrics and Gynecology

## 2024-02-29 DIAGNOSIS — Z1231 Encounter for screening mammogram for malignant neoplasm of breast: Secondary | ICD-10-CM

## 2024-03-22 ENCOUNTER — Ambulatory Visit
Admission: RE | Admit: 2024-03-22 | Discharge: 2024-03-22 | Disposition: A | Discharge status: Home / Self Care | Source: Ambulatory Visit | Attending: Obstetrics and Gynecology | Admitting: Obstetrics and Gynecology

## 2024-03-22 DIAGNOSIS — Z1231 Encounter for screening mammogram for malignant neoplasm of breast: Secondary | ICD-10-CM | POA: Insufficient documentation

## 2024-04-22 IMAGING — MG MM DIGITAL SCREENING BILAT W/ TOMO AND CAD
8 series · 8 of 24 positions shown · non-contrast
Comparison: Previous exam(s).

CLINICAL DATA: Screening.

EXAM:
DIGITAL SCREENING BILATERAL MAMMOGRAM WITH TOMOSYNTHESIS AND CAD
TECHNIQUE: Bilateral screening digital craniocaudal and mediolateral oblique
mammograms were obtained. Bilateral screening digital breast
tomosynthesis was performed. The images were evaluated with
computer-aided detection.

[R MLO synth-2D]
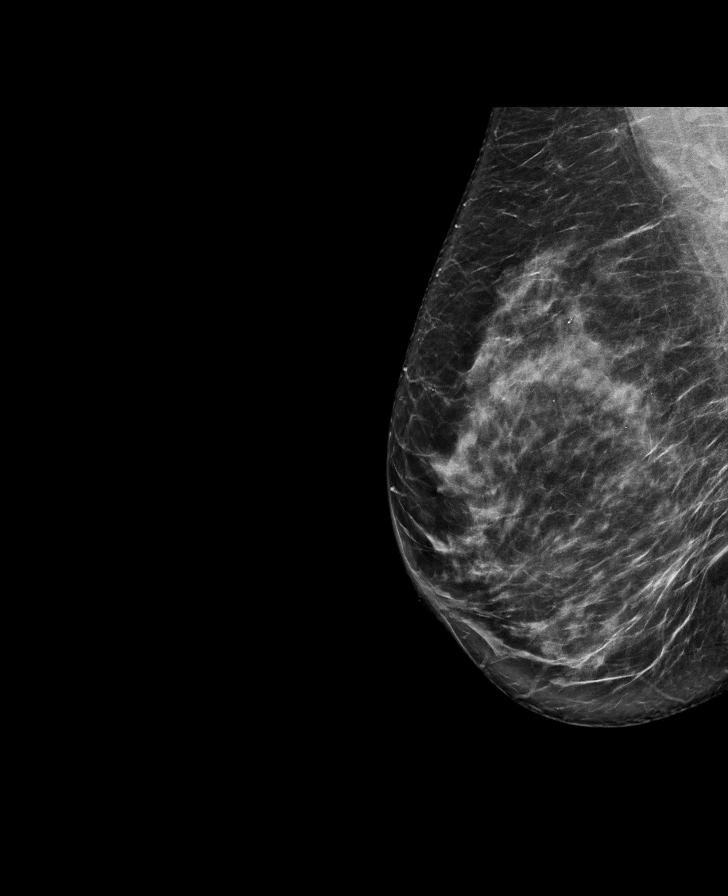

[L MLO synth-2D]
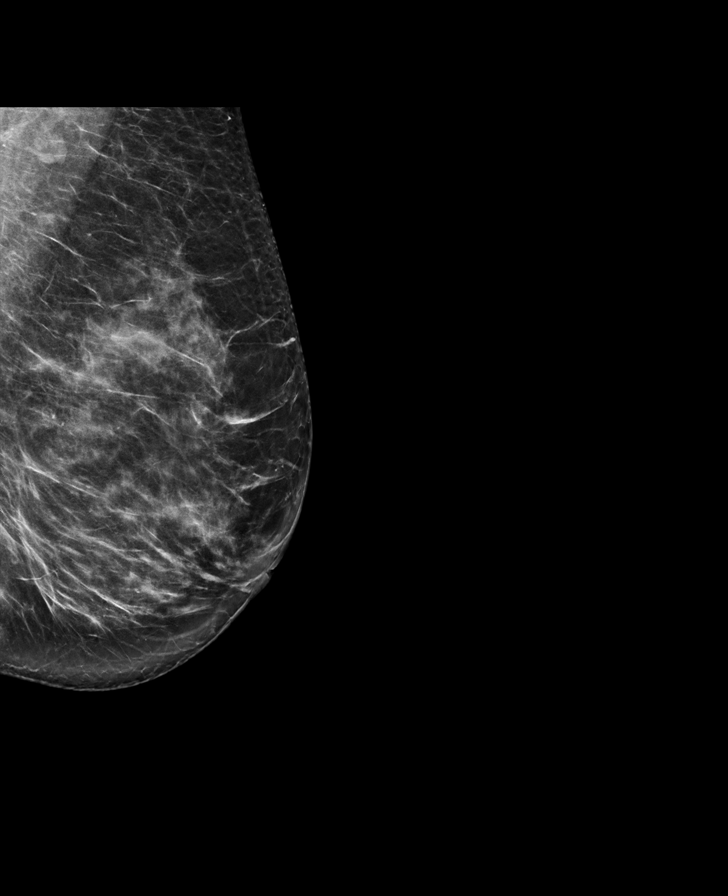

[L CC synth-2D]
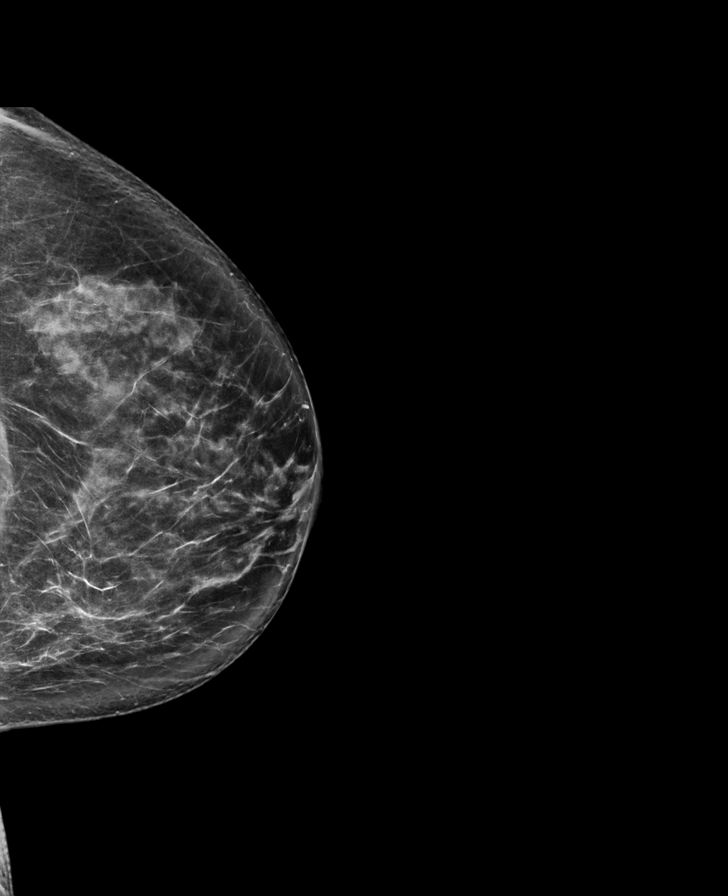

[R CC synth-2D]
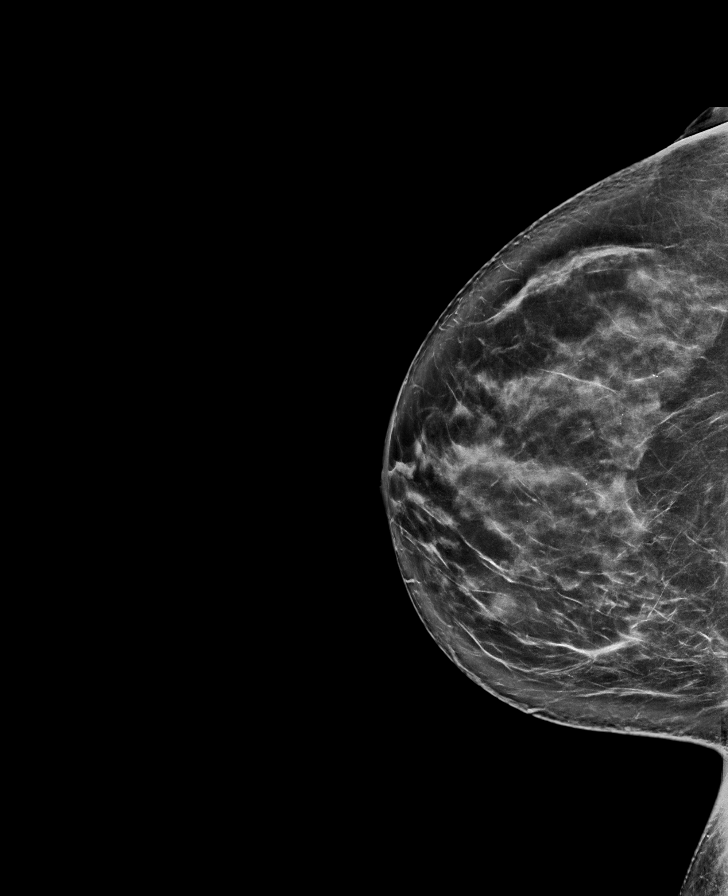

[R CC tomo · tomo slice 43/84.0]
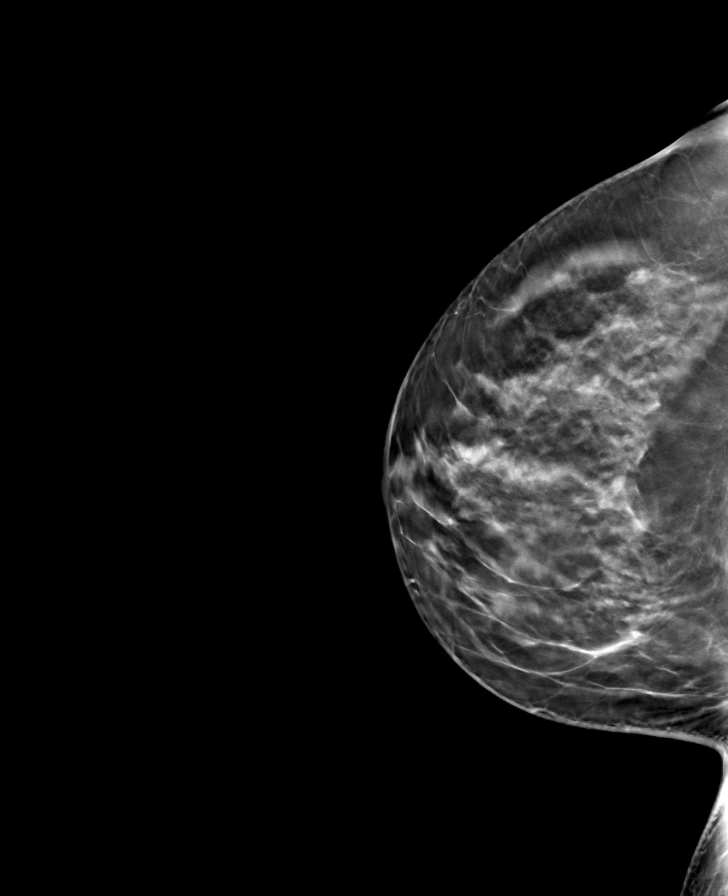

[R MLO tomo · tomo slice 41/82.0]
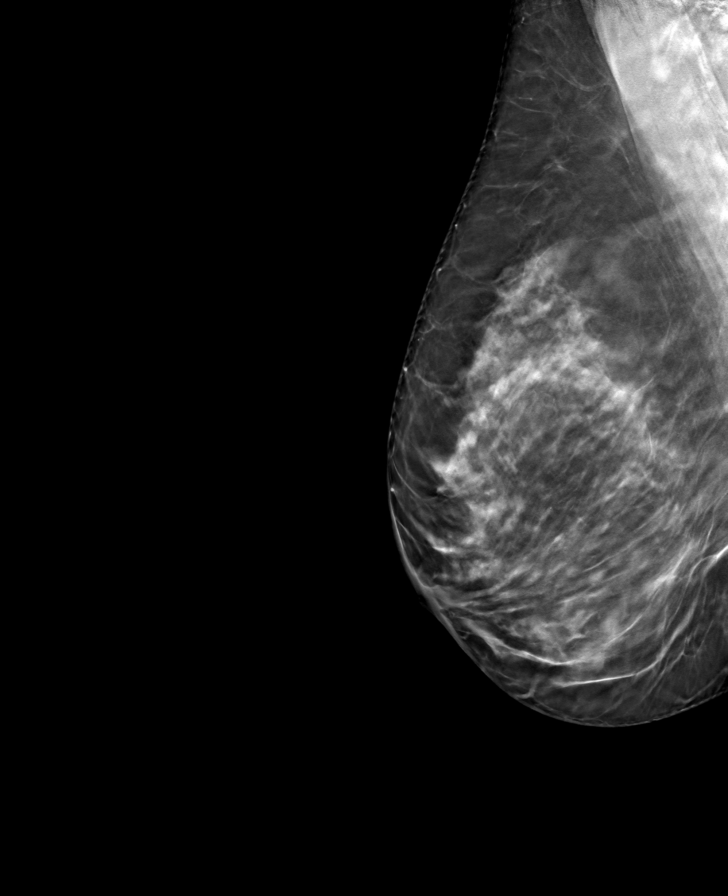

[L MLO tomo · tomo slice 39/78.0]
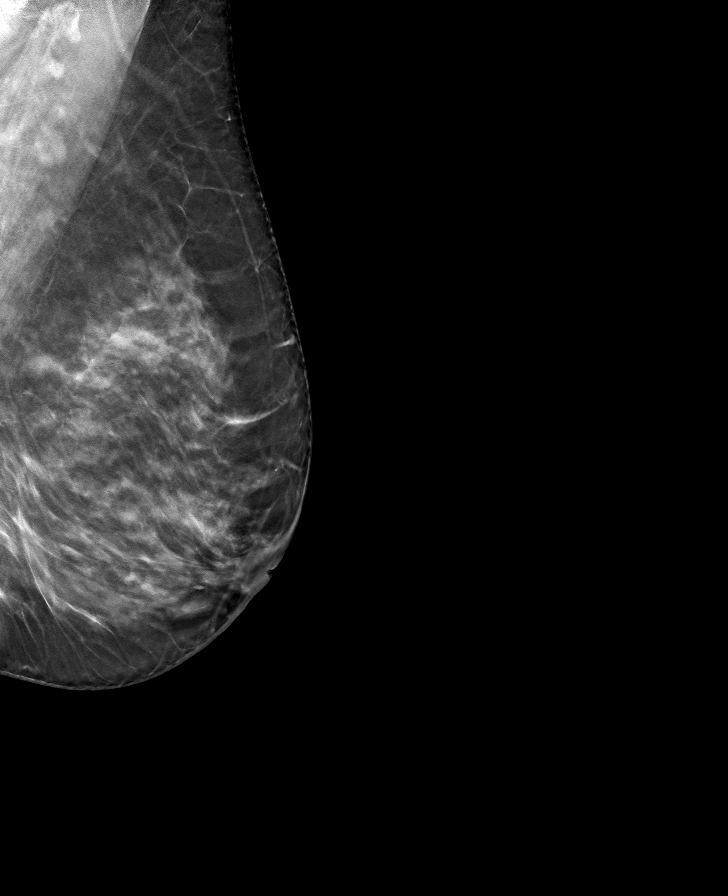

[L CC tomo · tomo slice 41/81.0]
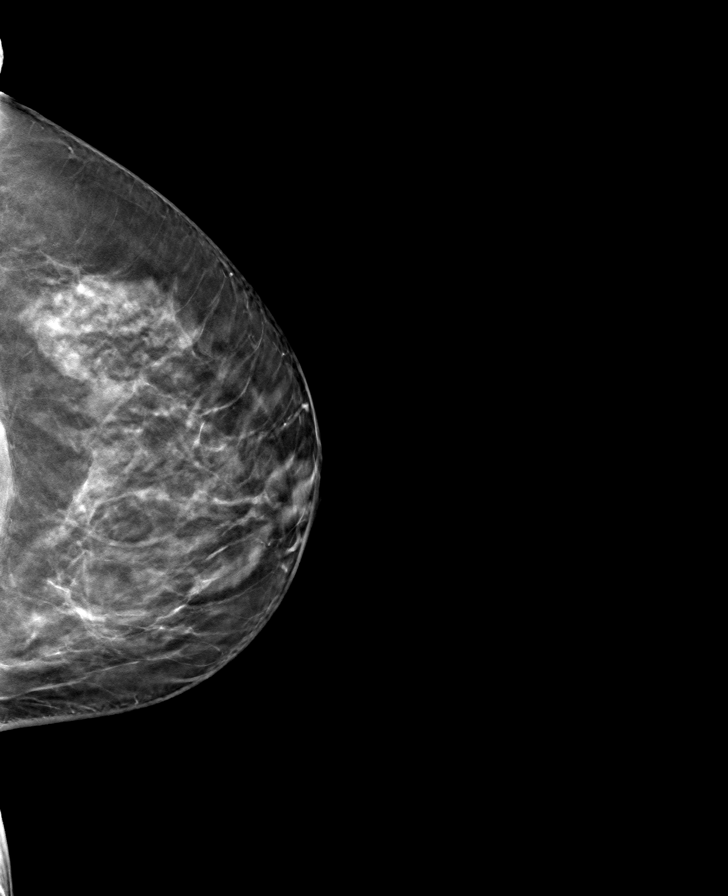

[8 of 24 positions shown; findings below may reference images not displayed]

ACR Breast Density Category c: The breast tissue is heterogeneously
dense, which may obscure small masses.
FINDINGS: There are no findings suspicious for malignancy.
IMPRESSION: No mammographic evidence of malignancy. A result letter of this
screening mammogram will be mailed directly to the patient.

RECOMMENDATION:
Screening mammogram in one year. (Code:Q3-W-BC3)

BI-RADS CATEGORY  1: Negative.
